# Patient Record
Sex: Female | Born: 1960 | Race: Black or African American | Hispanic: No | Marital: Married
Health system: Southern US, Community
[De-identification: ages and names within clinical notes are randomized; demographics above are authoritative.]

---

## 2005-04-02 ENCOUNTER — Other Ambulatory Visit: Admission: RE | Admit: 2005-04-02 | Discharge: 2005-04-02 | Payer: Self-pay | Admitting: Family Medicine

## 2005-08-12 ENCOUNTER — Encounter: Admission: RE | Admit: 2005-08-12 | Discharge: 2005-08-12 | Payer: Self-pay | Admitting: Family Medicine

## 2006-10-29 ENCOUNTER — Encounter: Admission: RE | Admit: 2006-10-29 | Discharge: 2006-10-29 | Payer: Self-pay | Admitting: Family Medicine

## 2006-11-17 ENCOUNTER — Encounter: Admission: RE | Admit: 2006-11-17 | Discharge: 2006-11-17 | Payer: Self-pay | Admitting: Family Medicine

## 2006-12-08 ENCOUNTER — Other Ambulatory Visit: Admission: RE | Admit: 2006-12-08 | Discharge: 2006-12-08 | Payer: Self-pay | Admitting: Family Medicine

## 2007-05-20 ENCOUNTER — Encounter: Admission: RE | Admit: 2007-05-20 | Discharge: 2007-05-20 | Payer: Self-pay | Admitting: Family Medicine

## 2008-05-11 ENCOUNTER — Encounter: Admission: RE | Admit: 2008-05-11 | Discharge: 2008-05-11 | Payer: Self-pay | Admitting: Family Medicine

## 2008-06-29 ENCOUNTER — Other Ambulatory Visit: Admission: RE | Admit: 2008-06-29 | Discharge: 2008-06-29 | Payer: Self-pay | Admitting: Family Medicine

## 2009-10-10 ENCOUNTER — Other Ambulatory Visit: Admission: RE | Admit: 2009-10-10 | Discharge: 2009-10-10 | Payer: Self-pay | Admitting: Family Medicine

## 2011-05-14 ENCOUNTER — Other Ambulatory Visit: Payer: Self-pay | Admitting: Family Medicine

## 2011-05-14 DIAGNOSIS — Z1231 Encounter for screening mammogram for malignant neoplasm of breast: Secondary | ICD-10-CM

## 2011-05-23 ENCOUNTER — Ambulatory Visit
Admission: RE | Admit: 2011-05-23 | Discharge: 2011-05-23 | Disposition: A | Discharge status: Home / Self Care | Payer: BC Managed Care – PPO | Source: Ambulatory Visit | Attending: Family Medicine | Admitting: Family Medicine

## 2011-05-23 DIAGNOSIS — Z1231 Encounter for screening mammogram for malignant neoplasm of breast: Secondary | ICD-10-CM

## 2011-06-20 ENCOUNTER — Other Ambulatory Visit: Payer: Self-pay | Admitting: Family Medicine

## 2011-06-20 DIAGNOSIS — R0989 Other specified symptoms and signs involving the circulatory and respiratory systems: Secondary | ICD-10-CM

## 2011-06-23 ENCOUNTER — Ambulatory Visit
Admission: RE | Admit: 2011-06-23 | Discharge: 2011-06-23 | Disposition: A | Discharge status: Home / Self Care | Payer: BC Managed Care – PPO | Source: Ambulatory Visit | Attending: Family Medicine | Admitting: Family Medicine

## 2011-06-23 DIAGNOSIS — R0989 Other specified symptoms and signs involving the circulatory and respiratory systems: Secondary | ICD-10-CM

## 2012-03-09 ENCOUNTER — Other Ambulatory Visit (HOSPITAL_COMMUNITY)
Admission: RE | Admit: 2012-03-09 | Discharge: 2012-03-09 | Disposition: A | Discharge status: Home / Self Care | Payer: BC Managed Care – PPO | Source: Ambulatory Visit | Attending: Family Medicine | Admitting: Family Medicine

## 2012-03-09 ENCOUNTER — Other Ambulatory Visit: Payer: Self-pay | Admitting: Family Medicine

## 2012-03-09 DIAGNOSIS — Z0000 Encounter for general adult medical examination without abnormal findings: Secondary | ICD-10-CM | POA: Insufficient documentation

## 2012-11-22 ENCOUNTER — Other Ambulatory Visit: Payer: Self-pay

## 2012-11-22 DIAGNOSIS — Z1231 Encounter for screening mammogram for malignant neoplasm of breast: Secondary | ICD-10-CM

## 2012-12-21 ENCOUNTER — Ambulatory Visit
Admission: RE | Admit: 2012-12-21 | Discharge: 2012-12-21 | Disposition: A | Discharge status: Home / Self Care | Payer: BC Managed Care – PPO | Source: Ambulatory Visit

## 2012-12-21 DIAGNOSIS — Z1231 Encounter for screening mammogram for malignant neoplasm of breast: Secondary | ICD-10-CM

## 2013-03-10 ENCOUNTER — Other Ambulatory Visit (HOSPITAL_COMMUNITY)
Admission: RE | Admit: 2013-03-10 | Discharge: 2013-03-10 | Disposition: A | Discharge status: Home / Self Care | Payer: BC Managed Care – PPO | Source: Ambulatory Visit | Attending: Family Medicine | Admitting: Family Medicine

## 2013-03-10 ENCOUNTER — Other Ambulatory Visit: Payer: Self-pay | Admitting: Family Medicine

## 2013-03-10 DIAGNOSIS — Z01419 Encounter for gynecological examination (general) (routine) without abnormal findings: Secondary | ICD-10-CM | POA: Insufficient documentation

## 2013-12-30 ENCOUNTER — Other Ambulatory Visit: Payer: Self-pay

## 2013-12-30 DIAGNOSIS — Z1231 Encounter for screening mammogram for malignant neoplasm of breast: Secondary | ICD-10-CM

## 2014-01-06 ENCOUNTER — Ambulatory Visit
Admission: RE | Admit: 2014-01-06 | Discharge: 2014-01-06 | Disposition: A | Discharge status: Home / Self Care | Payer: Self-pay | Source: Ambulatory Visit

## 2014-01-06 DIAGNOSIS — Z1231 Encounter for screening mammogram for malignant neoplasm of breast: Secondary | ICD-10-CM

## 2015-02-26 ENCOUNTER — Other Ambulatory Visit: Payer: Self-pay

## 2015-02-26 DIAGNOSIS — Z1231 Encounter for screening mammogram for malignant neoplasm of breast: Secondary | ICD-10-CM

## 2015-03-16 ENCOUNTER — Ambulatory Visit: Payer: Self-pay

## 2015-04-03 ENCOUNTER — Ambulatory Visit: Payer: Self-pay

## 2015-04-30 ENCOUNTER — Other Ambulatory Visit: Payer: Self-pay | Admitting: Physician Assistant

## 2015-04-30 ENCOUNTER — Other Ambulatory Visit (HOSPITAL_COMMUNITY)
Admission: RE | Admit: 2015-04-30 | Discharge: 2015-04-30 | Disposition: A | Discharge status: Home / Self Care | Payer: BLUE CROSS/BLUE SHIELD | Source: Ambulatory Visit | Attending: Physician Assistant | Admitting: Physician Assistant

## 2015-04-30 DIAGNOSIS — Z1151 Encounter for screening for human papillomavirus (HPV): Secondary | ICD-10-CM | POA: Insufficient documentation

## 2015-04-30 DIAGNOSIS — Z01419 Encounter for gynecological examination (general) (routine) without abnormal findings: Secondary | ICD-10-CM | POA: Diagnosis present

## 2015-05-02 LAB — CYTOLOGY - PAP

## 2015-07-16 ENCOUNTER — Ambulatory Visit
Admission: RE | Admit: 2015-07-16 | Discharge: 2015-07-16 | Disposition: A | Discharge status: Home / Self Care | Payer: BLUE CROSS/BLUE SHIELD | Source: Ambulatory Visit

## 2015-07-16 DIAGNOSIS — Z1231 Encounter for screening mammogram for malignant neoplasm of breast: Secondary | ICD-10-CM

## 2018-03-10 ENCOUNTER — Other Ambulatory Visit (HOSPITAL_COMMUNITY)
Admission: RE | Admit: 2018-03-10 | Discharge: 2018-03-10 | Disposition: A | Discharge status: Home / Self Care | Payer: Managed Care, Other (non HMO) | Source: Ambulatory Visit | Attending: Physician Assistant | Admitting: Physician Assistant

## 2018-03-10 ENCOUNTER — Other Ambulatory Visit: Payer: Self-pay | Admitting: Physician Assistant

## 2018-03-10 DIAGNOSIS — N950 Postmenopausal bleeding: Secondary | ICD-10-CM | POA: Diagnosis not present

## 2018-03-11 ENCOUNTER — Other Ambulatory Visit: Payer: Self-pay | Admitting: Physician Assistant

## 2018-03-11 DIAGNOSIS — N950 Postmenopausal bleeding: Secondary | ICD-10-CM

## 2018-03-11 LAB — CYTOLOGY - PAP
Diagnosis: NEGATIVE
HPV: NOT DETECTED

## 2018-03-24 ENCOUNTER — Ambulatory Visit
Admission: RE | Admit: 2018-03-24 | Discharge: 2018-03-24 | Disposition: A | Discharge status: Home / Self Care | Payer: Managed Care, Other (non HMO) | Source: Ambulatory Visit | Attending: Physician Assistant | Admitting: Physician Assistant

## 2018-03-24 DIAGNOSIS — N950 Postmenopausal bleeding: Secondary | ICD-10-CM

## 2018-09-13 ENCOUNTER — Other Ambulatory Visit: Payer: Self-pay | Admitting: Family Medicine

## 2018-09-13 DIAGNOSIS — Z1231 Encounter for screening mammogram for malignant neoplasm of breast: Secondary | ICD-10-CM

## 2018-10-11 ENCOUNTER — Ambulatory Visit
Admission: RE | Admit: 2018-10-11 | Discharge: 2018-10-11 | Disposition: A | Discharge status: Home / Self Care | Payer: Managed Care, Other (non HMO) | Source: Ambulatory Visit | Attending: Family Medicine | Admitting: Family Medicine

## 2018-10-11 ENCOUNTER — Encounter: Payer: Self-pay | Admitting: Radiology

## 2018-10-11 DIAGNOSIS — Z1231 Encounter for screening mammogram for malignant neoplasm of breast: Secondary | ICD-10-CM

## 2019-07-04 ENCOUNTER — Other Ambulatory Visit: Payer: Self-pay

## 2019-07-04 DIAGNOSIS — Z20822 Contact with and (suspected) exposure to covid-19: Secondary | ICD-10-CM

## 2019-07-06 LAB — NOVEL CORONAVIRUS, NAA: SARS-CoV-2, NAA: NOT DETECTED

## 2019-09-21 DIAGNOSIS — Z23 Encounter for immunization: Secondary | ICD-10-CM | POA: Diagnosis not present

## 2019-09-21 DIAGNOSIS — Z0000 Encounter for general adult medical examination without abnormal findings: Secondary | ICD-10-CM | POA: Diagnosis not present

## 2019-09-21 DIAGNOSIS — E7800 Pure hypercholesterolemia, unspecified: Secondary | ICD-10-CM | POA: Diagnosis not present

## 2019-09-21 DIAGNOSIS — B373 Candidiasis of vulva and vagina: Secondary | ICD-10-CM | POA: Diagnosis not present

## 2019-09-26 DIAGNOSIS — Z6841 Body Mass Index (BMI) 40.0 and over, adult: Secondary | ICD-10-CM | POA: Diagnosis not present

## 2019-09-26 DIAGNOSIS — M170 Bilateral primary osteoarthritis of knee: Secondary | ICD-10-CM | POA: Diagnosis not present

## 2019-11-24 ENCOUNTER — Ambulatory Visit: Payer: BC Managed Care – PPO | Attending: Internal Medicine

## 2019-11-24 DIAGNOSIS — Z23 Encounter for immunization: Secondary | ICD-10-CM

## 2019-11-24 NOTE — Progress Notes (Signed)
   Covid-19 Vaccination Clinic  Name:  Meredith Johnston    MRN: [id number] DOB: 02-12-1961  11/24/2019  Ms. Meredith Johnston was observed post Covid-19 immunization for 15 minutes without incident. She was provided with Vaccine Information Sheet and instruction to access the V-Safe system.   Ms. Meredith Johnston was instructed to call 911 with any severe reactions post vaccine: Marland Kitchen Difficulty breathing  . Swelling of face and throat  . A fast heartbeat  . A bad rash all over body  . Dizziness and weakness   Immunizations Administered    Name Date Dose VIS Date Route   Pfizer COVID-19 Vaccine 11/24/2019 11:04 AM 0.3 mL 08/26/2019 Intramuscular   Manufacturer: [employer], [employer]   Lot: [id number]   NDC: [address]-5

## 2019-12-19 ENCOUNTER — Ambulatory Visit: Payer: BC Managed Care – PPO | Attending: Internal Medicine

## 2019-12-19 DIAGNOSIS — Z23 Encounter for immunization: Secondary | ICD-10-CM

## 2020-01-04 DIAGNOSIS — Z03818 Encounter for observation for suspected exposure to other biological agents ruled out: Secondary | ICD-10-CM | POA: Diagnosis not present

## 2020-03-16 DIAGNOSIS — M1712 Unilateral primary osteoarthritis, left knee: Secondary | ICD-10-CM | POA: Diagnosis not present

## 2020-03-23 DIAGNOSIS — M1712 Unilateral primary osteoarthritis, left knee: Secondary | ICD-10-CM | POA: Diagnosis not present

## 2020-03-30 DIAGNOSIS — M1712 Unilateral primary osteoarthritis, left knee: Secondary | ICD-10-CM | POA: Diagnosis not present

## 2020-04-18 DIAGNOSIS — Z20828 Contact with and (suspected) exposure to other viral communicable diseases: Secondary | ICD-10-CM | POA: Diagnosis not present

## 2020-05-18 DIAGNOSIS — M25562 Pain in left knee: Secondary | ICD-10-CM | POA: Diagnosis not present

## 2020-05-18 DIAGNOSIS — M25561 Pain in right knee: Secondary | ICD-10-CM | POA: Diagnosis not present

## 2020-08-19 DIAGNOSIS — B349 Viral infection, unspecified: Secondary | ICD-10-CM | POA: Diagnosis not present

## 2020-08-19 DIAGNOSIS — R059 Cough, unspecified: Secondary | ICD-10-CM | POA: Diagnosis not present

## 2020-08-19 DIAGNOSIS — R0981 Nasal congestion: Secondary | ICD-10-CM | POA: Diagnosis not present

## 2020-08-19 DIAGNOSIS — Z03818 Encounter for observation for suspected exposure to other biological agents ruled out: Secondary | ICD-10-CM | POA: Diagnosis not present

## 2020-10-08 ENCOUNTER — Other Ambulatory Visit: Payer: Self-pay | Admitting: Family Medicine

## 2020-10-08 DIAGNOSIS — Z1231 Encounter for screening mammogram for malignant neoplasm of breast: Secondary | ICD-10-CM

## 2020-10-16 ENCOUNTER — Ambulatory Visit: Payer: BC Managed Care – PPO

## 2020-10-18 ENCOUNTER — Ambulatory Visit: Payer: BC Managed Care – PPO | Attending: Internal Medicine

## 2020-10-18 DIAGNOSIS — Z23 Encounter for immunization: Secondary | ICD-10-CM

## 2020-10-18 NOTE — Progress Notes (Signed)
   Covid-19 Vaccination Clinic  Name:  Meredith Johnston    MRN: [id number] DOB: April 18, 1961  10/18/2020  Meredith Johnston was observed post Covid-19 immunization for 30 minutes based on pre-vaccination screening without incident. She was provided with Vaccine Information Sheet and instruction to access the V-Safe system.   Meredith Johnston was instructed to call 911 with any severe reactions post vaccine: Marland Kitchen Difficulty breathing  . Swelling of face and throat  . A fast heartbeat  . A bad rash all over body  . Dizziness and weakness   Immunizations Administered    Name Date Dose VIS Date Route   PFIZER Comrnaty(Gray TOP) Covid-19 Vaccine 10/18/2020  3:27 PM 0.3 mL 08/23/2020 Intramuscular   Manufacturer: [employer], [employer]   Lot: [id number]   NDC: [phone]

## 2020-11-20 ENCOUNTER — Ambulatory Visit: Payer: BC Managed Care – PPO

## 2020-11-22 DIAGNOSIS — Z23 Encounter for immunization: Secondary | ICD-10-CM | POA: Diagnosis not present

## 2020-12-07 DIAGNOSIS — E7800 Pure hypercholesterolemia, unspecified: Secondary | ICD-10-CM | POA: Diagnosis not present

## 2020-12-07 DIAGNOSIS — Z0000 Encounter for general adult medical examination without abnormal findings: Secondary | ICD-10-CM | POA: Diagnosis not present

## 2020-12-07 DIAGNOSIS — B373 Candidiasis of vulva and vagina: Secondary | ICD-10-CM | POA: Diagnosis not present

## 2021-01-09 ENCOUNTER — Inpatient Hospital Stay: Admission: RE | Admit: 2021-01-09 | Payer: BC Managed Care – PPO | Source: Ambulatory Visit

## 2021-02-26 ENCOUNTER — Ambulatory Visit: Payer: BC Managed Care – PPO

## 2021-04-22 ENCOUNTER — Ambulatory Visit
Admission: RE | Admit: 2021-04-22 | Discharge: 2021-04-22 | Disposition: A | Discharge status: Home / Self Care | Payer: BC Managed Care – PPO | Source: Ambulatory Visit | Attending: Family Medicine | Admitting: Family Medicine

## 2021-04-22 ENCOUNTER — Other Ambulatory Visit: Payer: Self-pay

## 2021-04-22 DIAGNOSIS — Z1231 Encounter for screening mammogram for malignant neoplasm of breast: Secondary | ICD-10-CM

## 2021-07-31 DIAGNOSIS — R051 Acute cough: Secondary | ICD-10-CM | POA: Diagnosis not present

## 2021-07-31 DIAGNOSIS — R509 Fever, unspecified: Secondary | ICD-10-CM | POA: Diagnosis not present

## 2021-07-31 DIAGNOSIS — J069 Acute upper respiratory infection, unspecified: Secondary | ICD-10-CM | POA: Diagnosis not present

## 2021-07-31 DIAGNOSIS — Z20822 Contact with and (suspected) exposure to covid-19: Secondary | ICD-10-CM | POA: Diagnosis not present

## 2021-08-16 DIAGNOSIS — M170 Bilateral primary osteoarthritis of knee: Secondary | ICD-10-CM | POA: Diagnosis not present

## 2021-08-23 DIAGNOSIS — M170 Bilateral primary osteoarthritis of knee: Secondary | ICD-10-CM | POA: Diagnosis not present

## 2021-08-30 DIAGNOSIS — M170 Bilateral primary osteoarthritis of knee: Secondary | ICD-10-CM | POA: Diagnosis not present

## 2021-12-19 DIAGNOSIS — E7800 Pure hypercholesterolemia, unspecified: Secondary | ICD-10-CM | POA: Diagnosis not present

## 2021-12-19 DIAGNOSIS — B3731 Acute candidiasis of vulva and vagina: Secondary | ICD-10-CM | POA: Diagnosis not present

## 2021-12-19 DIAGNOSIS — R7309 Other abnormal glucose: Secondary | ICD-10-CM | POA: Diagnosis not present

## 2021-12-19 DIAGNOSIS — R7301 Impaired fasting glucose: Secondary | ICD-10-CM | POA: Diagnosis not present

## 2021-12-19 DIAGNOSIS — Z0000 Encounter for general adult medical examination without abnormal findings: Secondary | ICD-10-CM | POA: Diagnosis not present

## 2021-12-19 DIAGNOSIS — Z124 Encounter for screening for malignant neoplasm of cervix: Secondary | ICD-10-CM | POA: Diagnosis not present

## 2021-12-23 IMAGING — MG MM DIGITAL SCREENING BILAT W/ TOMO AND CAD
8 of 15 series · 8 of 40 positions shown · non-contrast
Comparison: Previous exam(s).

CLINICAL DATA: Screening.

EXAM:
DIGITAL SCREENING BILATERAL MAMMOGRAM WITH TOMOSYNTHESIS AND CAD
TECHNIQUE: Bilateral screening digital craniocaudal and mediolateral oblique
mammograms were obtained. Bilateral screening digital breast
tomosynthesis was performed. The images were evaluated with
computer-aided detection.

[R CV synth-2D]
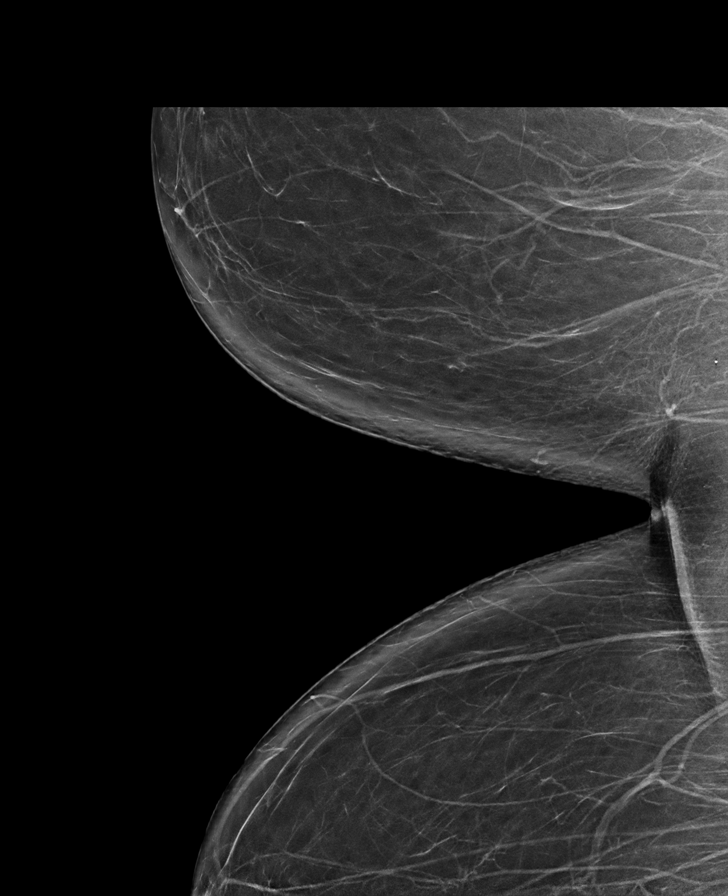

[L CC synth-2D]
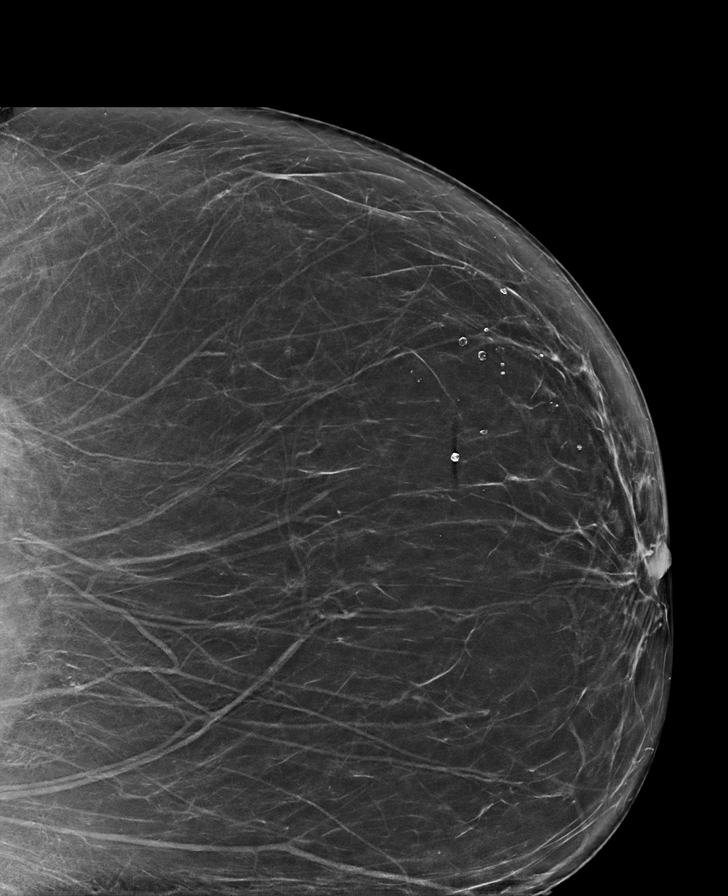

[R CC synth-2D (1 of 2)]
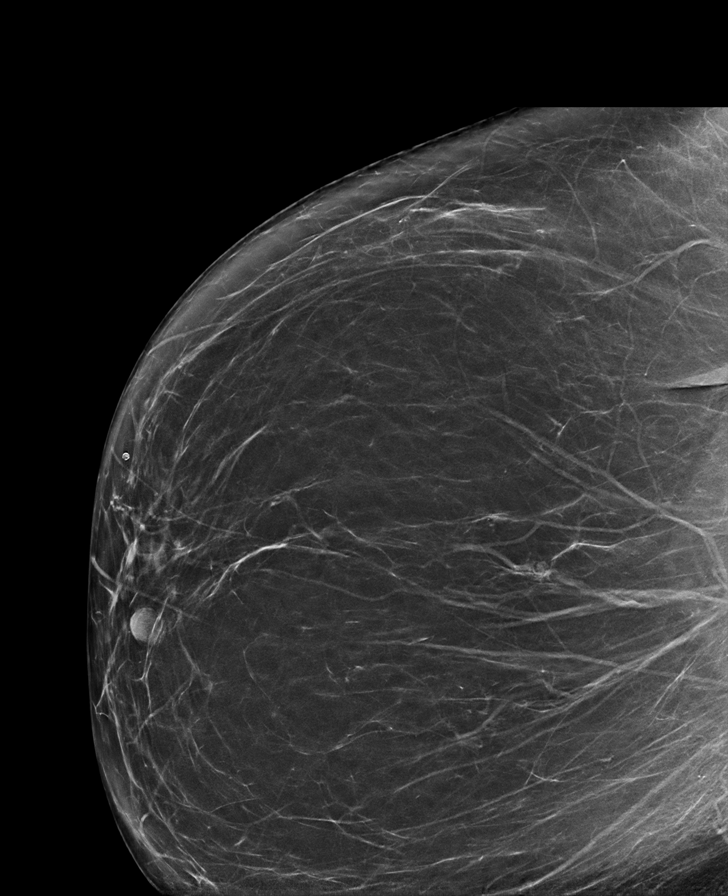

[R CC synth-2D (2 of 2)]
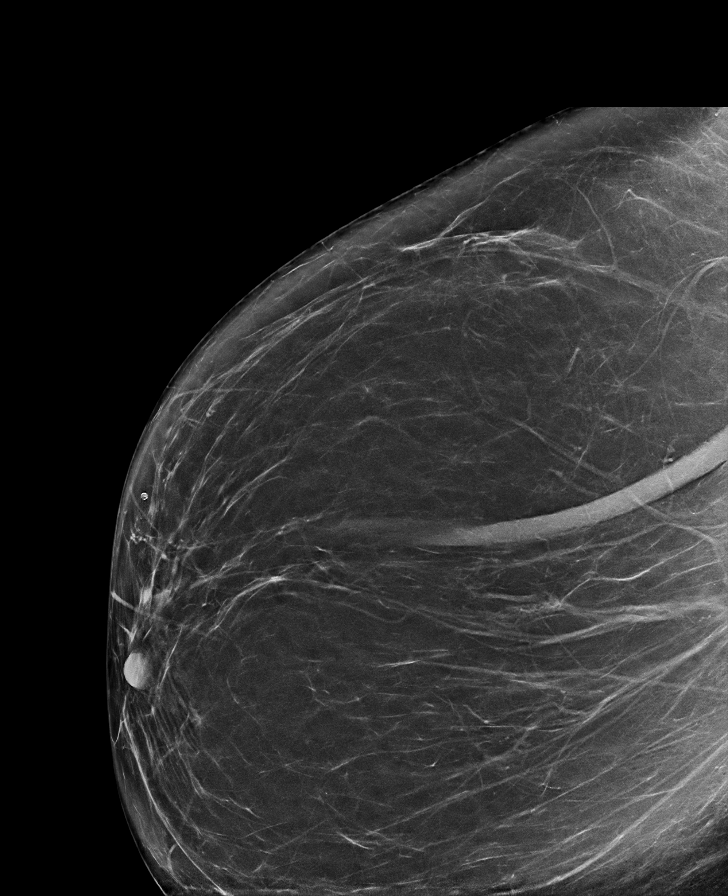

[L MLO synth-2D]
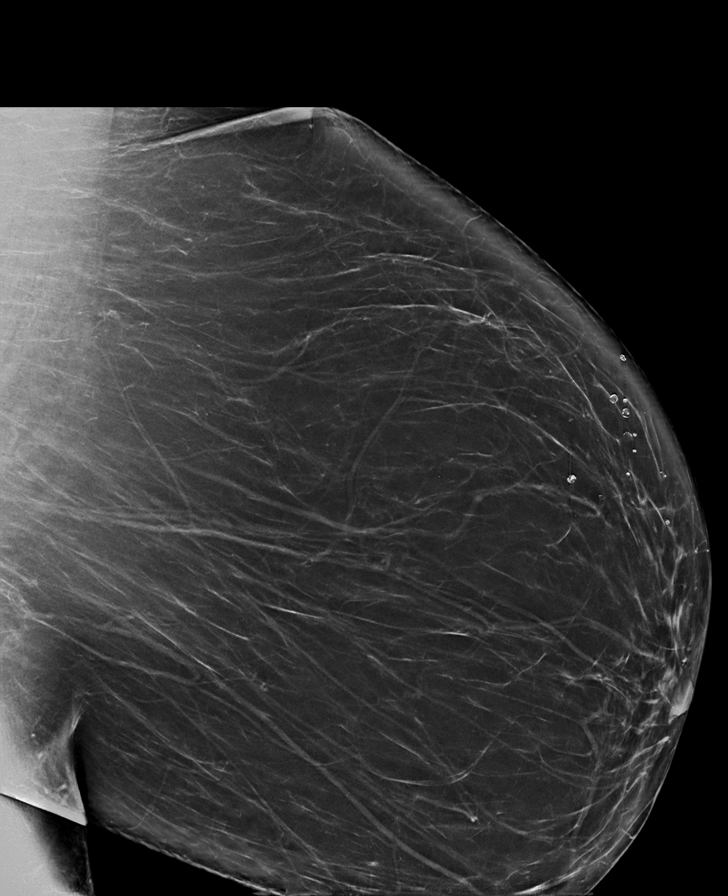

[R MLO synth-2D (1 of 2)]
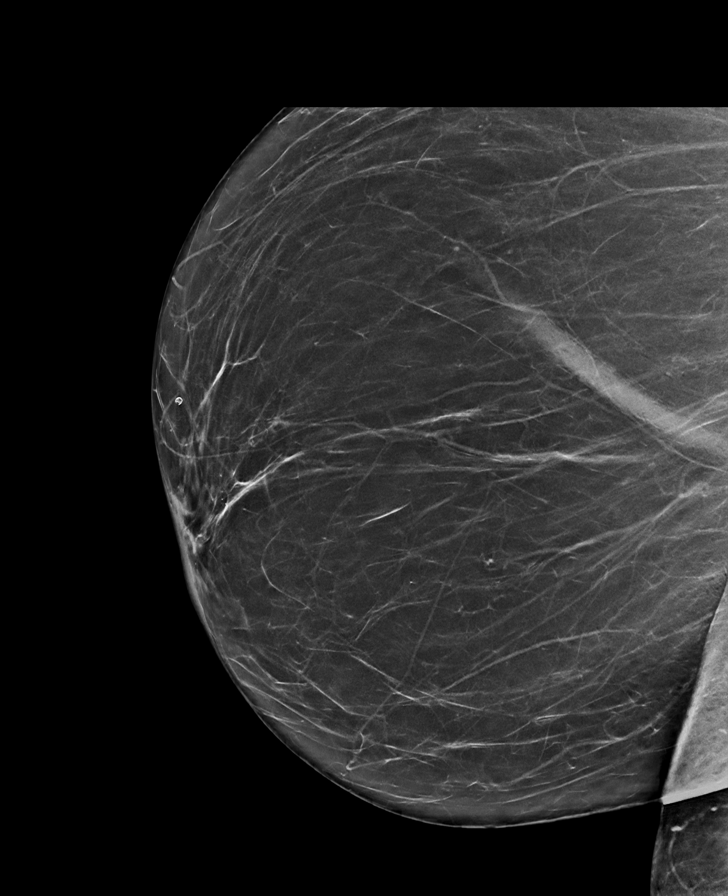

[R MLO synth-2D (2 of 2)]
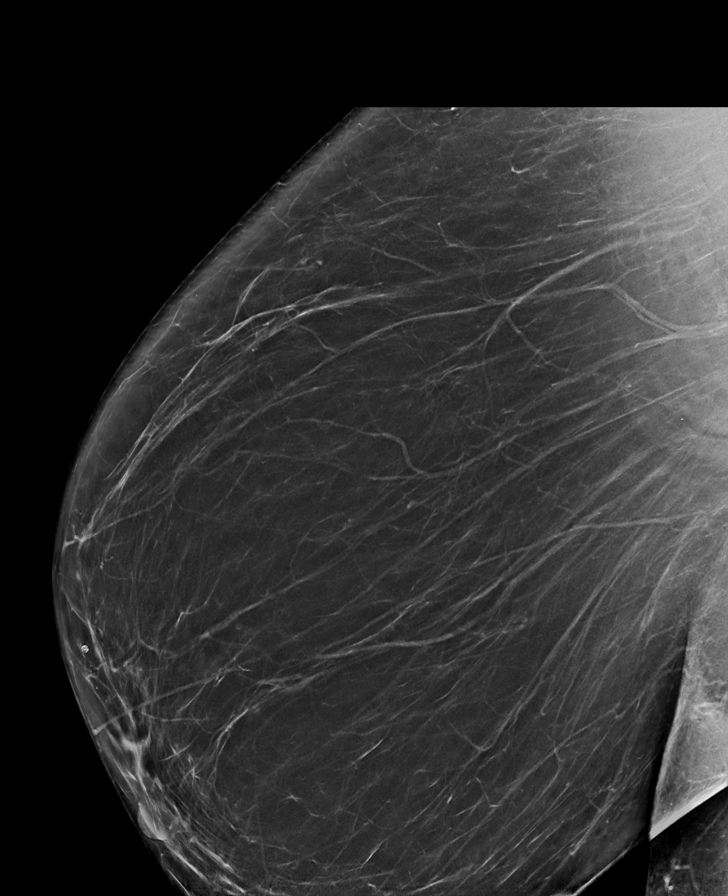

[R CV tomo · tomo slice 49/71.0]
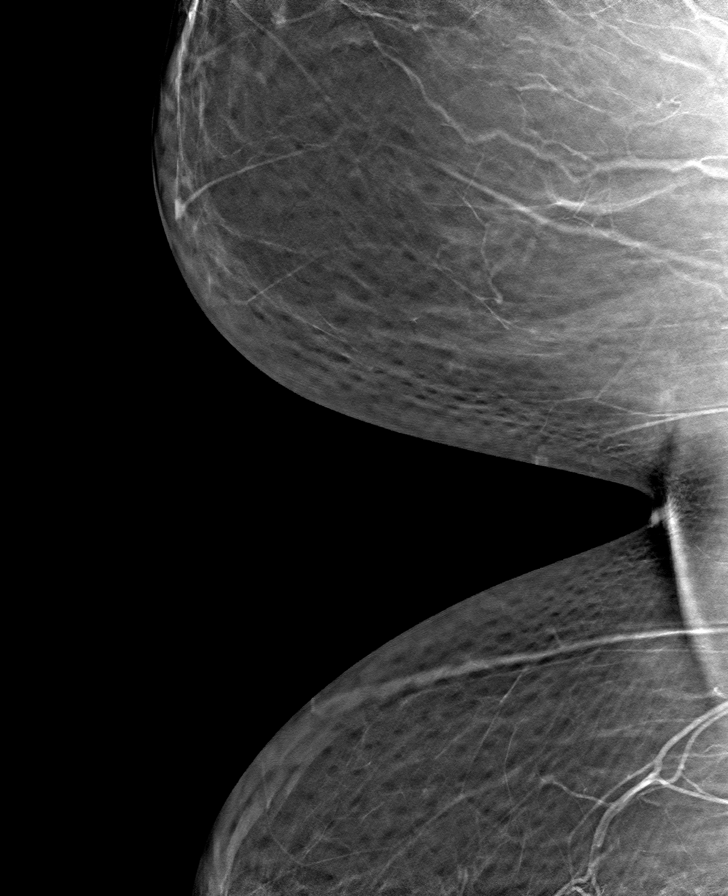

[8 of 40 positions shown; findings below may reference images not displayed]

ACR Breast Density Category b: There are scattered areas of
fibroglandular density.
FINDINGS: There are no findings suspicious for malignancy.
IMPRESSION: No mammographic evidence of malignancy. A result letter of this
screening mammogram will be mailed directly to the patient.

RECOMMENDATION:
Screening mammogram in one year. (Code:51-O-LD2)

BI-RADS CATEGORY  1: Negative.

## 2022-02-18 DIAGNOSIS — Z79899 Other long term (current) drug therapy: Secondary | ICD-10-CM | POA: Diagnosis not present

## 2022-02-18 DIAGNOSIS — R7303 Prediabetes: Secondary | ICD-10-CM | POA: Diagnosis not present

## 2022-02-18 DIAGNOSIS — R600 Localized edema: Secondary | ICD-10-CM | POA: Diagnosis not present

## 2022-02-18 DIAGNOSIS — M1712 Unilateral primary osteoarthritis, left knee: Secondary | ICD-10-CM | POA: Diagnosis not present

## 2022-04-07 DIAGNOSIS — R7303 Prediabetes: Secondary | ICD-10-CM | POA: Diagnosis not present

## 2022-04-07 DIAGNOSIS — E7800 Pure hypercholesterolemia, unspecified: Secondary | ICD-10-CM | POA: Diagnosis not present

## 2022-05-29 DIAGNOSIS — J069 Acute upper respiratory infection, unspecified: Secondary | ICD-10-CM | POA: Diagnosis not present

## 2022-05-29 DIAGNOSIS — R5381 Other malaise: Secondary | ICD-10-CM | POA: Diagnosis not present

## 2022-05-29 DIAGNOSIS — Z20822 Contact with and (suspected) exposure to covid-19: Secondary | ICD-10-CM | POA: Diagnosis not present

## 2022-06-20 DIAGNOSIS — Z23 Encounter for immunization: Secondary | ICD-10-CM | POA: Diagnosis not present

## 2022-06-20 DIAGNOSIS — R7303 Prediabetes: Secondary | ICD-10-CM | POA: Diagnosis not present

## 2022-06-20 DIAGNOSIS — F411 Generalized anxiety disorder: Secondary | ICD-10-CM | POA: Diagnosis not present

## 2022-08-15 DIAGNOSIS — M170 Bilateral primary osteoarthritis of knee: Secondary | ICD-10-CM | POA: Diagnosis not present

## 2022-08-27 DIAGNOSIS — M170 Bilateral primary osteoarthritis of knee: Secondary | ICD-10-CM | POA: Diagnosis not present

## 2022-09-02 DIAGNOSIS — E7800 Pure hypercholesterolemia, unspecified: Secondary | ICD-10-CM | POA: Diagnosis not present

## 2022-09-02 DIAGNOSIS — R7303 Prediabetes: Secondary | ICD-10-CM | POA: Diagnosis not present

## 2022-09-02 DIAGNOSIS — F411 Generalized anxiety disorder: Secondary | ICD-10-CM | POA: Diagnosis not present

## 2022-09-03 DIAGNOSIS — M170 Bilateral primary osteoarthritis of knee: Secondary | ICD-10-CM | POA: Diagnosis not present

## 2022-09-11 ENCOUNTER — Other Ambulatory Visit: Payer: Self-pay | Admitting: Family Medicine

## 2022-09-11 DIAGNOSIS — Z1231 Encounter for screening mammogram for malignant neoplasm of breast: Secondary | ICD-10-CM

## 2022-10-31 ENCOUNTER — Ambulatory Visit: Payer: BC Managed Care – PPO

## 2022-12-11 ENCOUNTER — Ambulatory Visit
Admission: RE | Admit: 2022-12-11 | Discharge: 2022-12-11 | Disposition: A | Discharge status: Home / Self Care | Payer: 59 | Source: Ambulatory Visit | Attending: Family Medicine | Admitting: Family Medicine

## 2022-12-11 DIAGNOSIS — Z1231 Encounter for screening mammogram for malignant neoplasm of breast: Secondary | ICD-10-CM

## 2022-12-29 ENCOUNTER — Other Ambulatory Visit: Payer: Self-pay | Admitting: Family Medicine

## 2022-12-29 DIAGNOSIS — R1011 Right upper quadrant pain: Secondary | ICD-10-CM

## 2023-01-22 ENCOUNTER — Ambulatory Visit
Admission: RE | Admit: 2023-01-22 | Discharge: 2023-01-22 | Disposition: A | Discharge status: Home / Self Care | Payer: 59 | Source: Ambulatory Visit | Attending: Family Medicine | Admitting: Family Medicine

## 2023-01-22 DIAGNOSIS — R1011 Right upper quadrant pain: Secondary | ICD-10-CM

## 2024-01-01 ENCOUNTER — Other Ambulatory Visit: Payer: Self-pay | Admitting: Family Medicine

## 2024-01-01 DIAGNOSIS — N6324 Unspecified lump in the left breast, lower inner quadrant: Secondary | ICD-10-CM

## 2024-02-18 ENCOUNTER — Other Ambulatory Visit

## 2024-02-18 ENCOUNTER — Encounter

## 2024-03-14 ENCOUNTER — Ambulatory Visit
Admission: RE | Admit: 2024-03-14 | Discharge: 2024-03-14 | Disposition: A | Discharge status: Home / Self Care | Source: Ambulatory Visit | Attending: Family Medicine | Admitting: Family Medicine

## 2024-03-14 DIAGNOSIS — N6324 Unspecified lump in the left breast, lower inner quadrant: Secondary | ICD-10-CM
